# Patient Record
Sex: Male | Born: 1965 | Race: Black or African American | Hispanic: No | Marital: Married | Smoking: Current some day smoker
Health system: Southern US, Community
[De-identification: ages and names within clinical notes are randomized; demographics above are authoritative.]

## PROBLEM LIST (undated history)

## (undated) DIAGNOSIS — I10 Essential (primary) hypertension: Secondary | ICD-10-CM

## (undated) HISTORY — PX: HERNIA REPAIR: SHX51

---

## 2016-05-07 ENCOUNTER — Encounter (HOSPITAL_BASED_OUTPATIENT_CLINIC_OR_DEPARTMENT_OTHER): Payer: Self-pay | Admitting: *Deleted

## 2016-05-07 ENCOUNTER — Emergency Department (HOSPITAL_BASED_OUTPATIENT_CLINIC_OR_DEPARTMENT_OTHER)
Admission: EM | Admit: 2016-05-07 | Discharge: 2016-05-07 | Disposition: A | Discharge status: Home / Self Care | Payer: BLUE CROSS/BLUE SHIELD | Attending: Emergency Medicine | Admitting: Emergency Medicine

## 2016-05-07 DIAGNOSIS — Z87891 Personal history of nicotine dependence: Secondary | ICD-10-CM | POA: Insufficient documentation

## 2016-05-07 DIAGNOSIS — Z9889 Other specified postprocedural states: Secondary | ICD-10-CM | POA: Diagnosis not present

## 2016-05-07 DIAGNOSIS — R1031 Right lower quadrant pain: Secondary | ICD-10-CM | POA: Diagnosis present

## 2016-05-07 DIAGNOSIS — Z8719 Personal history of other diseases of the digestive system: Secondary | ICD-10-CM

## 2016-05-07 LAB — URINALYSIS, ROUTINE W REFLEX MICROSCOPIC
Bilirubin Urine: NEGATIVE
Glucose, UA: NEGATIVE mg/dL
HGB URINE DIPSTICK: NEGATIVE
Ketones, ur: NEGATIVE mg/dL
Leukocytes, UA: NEGATIVE
Nitrite: NEGATIVE
PH: 6 (ref 5.0–8.0)
Protein, ur: NEGATIVE mg/dL
SPECIFIC GRAVITY, URINE: 1.002 — AB (ref 1.005–1.030)

## 2016-05-07 NOTE — ED Provider Notes (Signed)
CSN: [id number]     Arrival date & time 05/07/16  1331 History   First MD Initiated Contact with Patient 05/07/16 1349     Chief Complaint  Patient presents with  . Abdominal Pain     (Consider location/radiation/quality/duration/timing/severity/associated sxs/prior Treatment) HPI   50 year old male with prior history of hernia repair presenting today with complaint of irritation at the hernia site. Patient reporting 2004 he had an elective R inguinal hernia surgery done at [facility] in [place].  He has not had any problem since. Patient works at a job that requires occasional heavy lifting. He also is an avid workout enthusiast and admits to having increase his workout regimen recently. Yesterday he has been doing moderate intensity workout several hours later he experiencing an irritation which he described as "rubbing sensation" to his right lower quadrant on at the surgical site. The sensation is noticeable when he leans down or when he lift any heavy objects.  He did had two loose stool yesterday but normal today. Otherwise patient denies fever, chills, headache, lightheadedness, dizziness, abdominal pain, back pain, dysuria, hematuria, or radicular leg pain, or rash. He has not noticed any penile pain, testicular pain, scrotal swelling. He denies any significant discomfort at this time.     History reviewed. No pertinent past medical history. Past Surgical History  Procedure Laterality Date  . Hernia repair     No family history on file. Social History  Substance Use Topics  . Smoking status: Former Games developer  . Smokeless tobacco: Never Used  . Alcohol Use: Yes     Comment: weekly    Review of Systems  Constitutional: Negative for fever.  Gastrointestinal: Negative for abdominal pain and rectal pain.  Genitourinary: Negative for difficulty urinating, penile pain and testicular pain.  Musculoskeletal: Negative for back pain.  Skin: Negative for rash and wound.      Allergies   Review of patient's allergies indicates no known allergies.  Home Medications   Prior to Admission medications   Not on File   BP 161/82 mmHg  Pulse 73  Temp(Src) 98.8 F (37.1 C) (Oral)  Resp 16  Ht  (1.803 m)  Wt 104.327 kg  BMI 32.09 kg/m2  SpO2 100% Physical Exam  Constitutional: He appears well-developed and well-nourished. No distress.  Muscularly built African-American male laying in bed in no acute discomfort.  HENT:  Head: Atraumatic.  Eyes: Conjunctivae are normal.  Neck: Neck supple.  Cardiovascular: Normal rate and regular rhythm.   Pulmonary/Chest: Effort normal and breath sounds normal.  Abdominal: Soft. Bowel sounds are normal. He exhibits no distension. There is no tenderness.  Genitourinary:  Chaperone present during exam. Normal circumcised penis with normal penile shaft nontender. Testicles with normal lie and no scrotal swelling and no tenderness. No inguinal lymphadenopathy or inguinal hernia appreciated.  Neurological: He is alert.  Skin: No rash noted.  Psychiatric: He has a normal mood and affect.  Nursing note and vitals reviewed.   ED Course  Procedures (including critical care time) Labs Review Labs Reviewed  URINALYSIS, ROUTINE W REFLEX MICROSCOPIC (NOT AT [facility]) - Abnormal; Notable for the following:    Specific Gravity, Urine 1.002 (*)    All other components within normal limits    Imaging Review No results found. I have personally reviewed and evaluated these images and lab results as part of my medical decision-making.   EKG Interpretation None      MDM   Final diagnoses:  Status post right inguinal hernia  repair    BP 161/82 mmHg  Pulse 73  Temp(Src) 98.8 F (37.1 C) (Oral)  Resp 16  Ht 5\' 11"  (1.803 m)  Wt 104.327 kg  BMI 32.09 kg/m2  SpO2 100%   2:30 PM Pt here with localized irritation at the previous R inguinal hernia repair 13 years ago.  On exam, no evidence of hernia appreciated.  No signs of  incarceration or strangulation.  No evidence of cellulitis.  Abdomen is soft and nontender.  Doubt appy, kidney stone, pyelo, colitis, Forniere's Gangrene, inguinal incarceration or strangulation. Recommend avoid heavy lifting and f/u with CSS for further management. Return precaution discussed.  Work note provided.   Fayrene HelperBowie Raequon Catanzaro, PA-C 05/07/16 1439  Vanetta MuldersScott Zackowski, MD 05/08/16 1550

## 2016-05-07 NOTE — ED Notes (Signed)
Pt reports that yesterday he developed pain in R pelvic area where he had a hernia operation around 2004. Also reports associated nausea and diarrhea x2 yesterday (has since resolved). Denies fever.

## 2016-05-07 NOTE — Discharge Instructions (Signed)
Your discomfort may be related to irritation at the mesh site from prior hernia repair.  Please avoid heavy lifting and follow up with surgery for further management.  Return if you develop fever, worsening abdominal pain, scrotal swelling, peeing out blood or if you have other concerns.

## 2017-09-21 ENCOUNTER — Emergency Department (HOSPITAL_BASED_OUTPATIENT_CLINIC_OR_DEPARTMENT_OTHER)
Admission: EM | Admit: 2017-09-21 | Discharge: 2017-09-21 | Disposition: A | Discharge status: Home / Self Care | Payer: BLUE CROSS/BLUE SHIELD | Attending: Emergency Medicine | Admitting: Emergency Medicine

## 2017-09-21 ENCOUNTER — Encounter (HOSPITAL_BASED_OUTPATIENT_CLINIC_OR_DEPARTMENT_OTHER): Payer: Self-pay | Admitting: Emergency Medicine

## 2017-09-21 ENCOUNTER — Emergency Department (HOSPITAL_BASED_OUTPATIENT_CLINIC_OR_DEPARTMENT_OTHER): Payer: BLUE CROSS/BLUE SHIELD

## 2017-09-21 ENCOUNTER — Other Ambulatory Visit: Payer: Self-pay

## 2017-09-21 DIAGNOSIS — Z87891 Personal history of nicotine dependence: Secondary | ICD-10-CM | POA: Insufficient documentation

## 2017-09-21 DIAGNOSIS — M25512 Pain in left shoulder: Secondary | ICD-10-CM | POA: Insufficient documentation

## 2017-09-21 MED ORDER — CYCLOBENZAPRINE HCL 10 MG PO TABS
10.0000 mg | ORAL_TABLET | Freq: Two times a day (BID) | ORAL | 0 refills | Status: AC | PRN
Start: 2017-09-21 — End: ?

## 2017-09-21 MED FILL — CYCLOBENZAPRINE 10 MG TABLE: 10 | 10 days supply | Qty: 20 | Fill #0

## 2017-09-21 NOTE — ED Triage Notes (Signed)
Pain to left shoulder and elbow at times x 6 months, worse at night and is unable to sleep.

## 2017-09-21 NOTE — Discharge Instructions (Signed)
You can take Tylenol or Ibuprofen as directed for pain. You can alternate Tylenol and Ibuprofen every 4 hours. If you take Tylenol at 1pm, then you can take Ibuprofen at 5pm. Then you can take Tylenol again at 9pm.   Take Flexeril as prescribed. This medication will make you drowsy so do not drive or drink alcohol when taking it.  Follow-up with referred orthopedic doctor for further evaluation.  Return to the emergency department for any worsening pain, redness or swelling of the hip, numbness/weakness of the hand or any other worsening or concerning symptoms.

## 2017-09-21 NOTE — ED Provider Notes (Signed)
[facility] EMERGENCY DEPARTMENT Provider Note   CSN: [id number] Arrival date & time: 09/21/17  4098     History   Chief Complaint Chief Complaint  Patient presents with  . Shoulder Pain    HPI Eric Charles is a 51 y.o. male who presents for evaluation of left shoulder and elbow pain that is been ongoing for the last 6 months.  Patient reports that pain has been intermittent but has become more frequent and more severe over the last several weeks.  He states that he has not had any preceding trauma, injury, fall.  He reports that he does a lot of overhead lifting and repetitive motion work, which he feels like exacerbates his symptoms.  He has not sought evaluation for the symptoms previously.  He has not taken any medications for the pain.  He still has been able to do his daily activities.  Patient denies any fevers, CP, SOB, redness or swelling of the arm, numbness/weakness.  The history is provided by the patient.    History reviewed. No pertinent past medical history.  There are no active problems to display for this patient.   Past Surgical History:  Procedure Laterality Date  . HERNIA REPAIR         Home Medications    Prior to Admission medications   Medication Sig Start Date End Date Taking? Authorizing Provider  cyclobenzaprine (FLEXERIL) 10 MG tablet Take 1 tablet (10 mg total) by mouth 2 (two) times daily as needed for muscle spasms. 09/21/17   Maxwell Caul, PA-C    Family History No family history on file.  Social History Social History   Tobacco Use  . Smoking status: Former Games developer  . Smokeless tobacco: Never Used  Substance Use Topics  . Alcohol use: No    Frequency: Never  . Drug use: No     Allergies   Patient has no known allergies.   Review of Systems Review of Systems  Constitutional: Negative for fever.  Respiratory: Negative for shortness of breath.   Cardiovascular: Negative for chest pain.  Musculoskeletal:     Left shoulder and elbow pain  Skin: Negative for color change.  Neurological: Negative for weakness and numbness.     Physical Exam Updated Vital Signs BP (!) 153/99 (BP Location: Right Arm)   Pulse 65   Temp 98.1 F (36.7 C) (Oral)   Resp 18   Ht 5\' 11"  (1.803 m)   Wt 99.8 kg (220 lb)   SpO2 100%   BMI 30.68 kg/m   Physical Exam  Constitutional: He is oriented to person, place, and time. He appears well-developed and well-nourished.  HENT:  Head: Normocephalic and atraumatic.  Eyes: Conjunctivae and EOM are normal. Right eye exhibits no discharge. Left eye exhibits no discharge. No scleral icterus.  Cardiovascular:  Pulses:      Radial pulses are 2+ on the right side, and 2+ on the left side.  Pulmonary/Chest: Effort normal.  Musculoskeletal:  Left upper extremity with positive Hawking's test, slight weakness on liftoff test and positive Neer's impingement.  Negative NT can test.  Diffuse tenderness palpation overlying the anterior deltoid.  No overlying warmth, erythema, ecchymosis.  No deformity or crepitus noted.  No tenderness palpation to the proximal upper extremity, left elbow.  Flexion/extension of left elbow intact but patient reports some subjective pain with flexion.  Internal and external rotation of extremities intact without difficulty.  Flexion/extension of left shoulder intact without difficulty.  Patient does report  some pain with abduction of the left shoulder.  No abnormalities of the right upper extremity.  Neurological: He is alert and oriented to person, place, and time.  Follows commands, Moves all extremities  5/5 strength to BUE  Sensation intact throughout all major nerve distributions  Skin: Skin is warm and dry. Capillary refill takes less than 2 seconds.  LUE is not dusky in appearance or cool to touch.  Psychiatric: He has a normal mood and affect. His speech is normal and behavior is normal.  Nursing note and vitals reviewed.    ED Treatments  / Results  Labs (all labs ordered are listed, but only abnormal results are displayed) Labs Reviewed - No data to display  EKG  EKG Interpretation None       Radiology Dg Elbow Complete Left  Result Date: 09/21/2017 CLINICAL DATA:  Pain over the last 6 months. Repetitive motion injury. EXAM: LEFT ELBOW - COMPLETE 3+ VIEW COMPARISON:  None. FINDINGS: No visible effusion. No joint space narrowing or osteophytes. No visible loose body. IMPRESSION: Within normal limits. Electronically Signed   By: Paulina FusiMark  Shogry M.D.   On: 09/21/2017 10:42   Dg Shoulder Left  Result Date: 09/21/2017 CLINICAL DATA:  Left shoulder pain, 6 months duration. Repetitive motion. EXAM: LEFT SHOULDER - 2+ VIEW COMPARISON:  None. FINDINGS: Glenohumeral joint is normal. There are mild sclerotic changes of the greater tuberosity region that can be seen with chronic rotator cuff disease. Mild narrowing of the humeral acromial distance. Mild AC joint degenerative change. IMPRESSION: No acute radiographic finding. Chronic bone changes at the greater tuberosity, mild narrowing of the humeral acromial distance and mild AC joint degenerative changes all could be symptomatic. Electronically Signed   By: Paulina FusiMark  Shogry M.D.   On: 09/21/2017 10:41    Procedures Procedures (including critical care time)  Medications Ordered in ED Medications - No data to display   Initial Impression / Assessment and Plan / ED Course  I have reviewed the triage vital signs and the nursing notes.  Pertinent labs & imaging results that were available during my care of the patient were reviewed by me and considered in my medical decision making (see chart for details).     51 year old male who presents with left shoulder and elbow pain that has been ongoing for the last 6 months.  No preceding trauma, injury, fall.  Comes the ED today because pain has become more frequent and severe is interrupted is sleeping.  He has not taken any medications for  the pain.  Patient does do a lot of heavy lifting and motion at work. Patient is afebrile, non-toxic appearing, sitting comfortably on examination table. Vital signs reviewed and stable. Patient is neurovascularly intact.  Physical exam shows tenderness palpation to the deltoid.  Positive Hawking's, positive Neer's on left shoulder.  No abnormalities of the right upper extremity.  Consider sprain versus strain versus tendinitis versus rotator cuff pathology (partial tear, impingement).  Low suspicion for fracture versus dislocation. Plan to obtain XR imaging for further evaluation.   X-rays reviewed.  Negative for any acute abnormalities.  They do mention some degenerative changes.  Discussed results with patient.  We will plan to treat as muscle strain.  Plan to give Flexeril for symptomatic relief. Plan to provide outpatient orthopedic referral for further follow-up and evaluation. Patient had ample opportunity for questions and discussion. All patient's questions were answered with full understanding. Strict return precautions discussed. Patient expresses understanding and agreement to plan.  Final Clinical Impressions(s) / ED Diagnoses   Final diagnoses:  Acute pain of left shoulder    ED Discharge Orders        Ordered    cyclobenzaprine (FLEXERIL) 10 MG tablet  2 times daily PRN     09/21/17 1046       Maxwell CaulLayden, Saleah Rishel A, PA-C 09/21/17 1746    Lavera GuiseLiu, Dana Duo, MD 09/24/17 2204

## 2018-06-15 IMAGING — CR DG ELBOW COMPLETE 3+V*L*
4 series · 4 of 4 positions shown · non-contrast
Comparison: None.

CLINICAL DATA: Pain over the last 6 months. Repetitive motion
injury.

EXAM:
LEFT ELBOW - COMPLETE 3+ VIEW

[x elbow joint ap left]
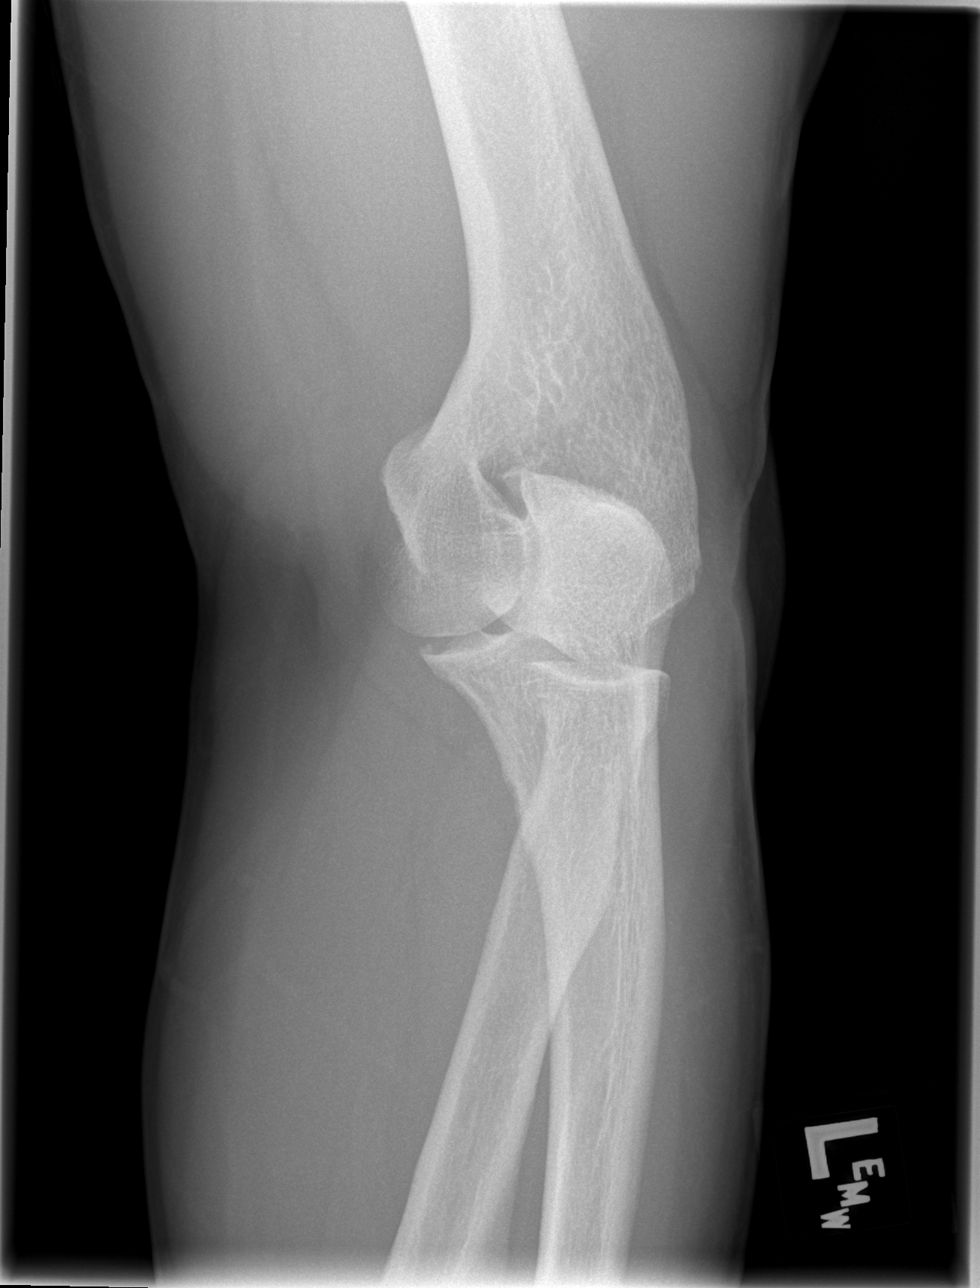

[x elbow joint obl. left (1 of 2)]
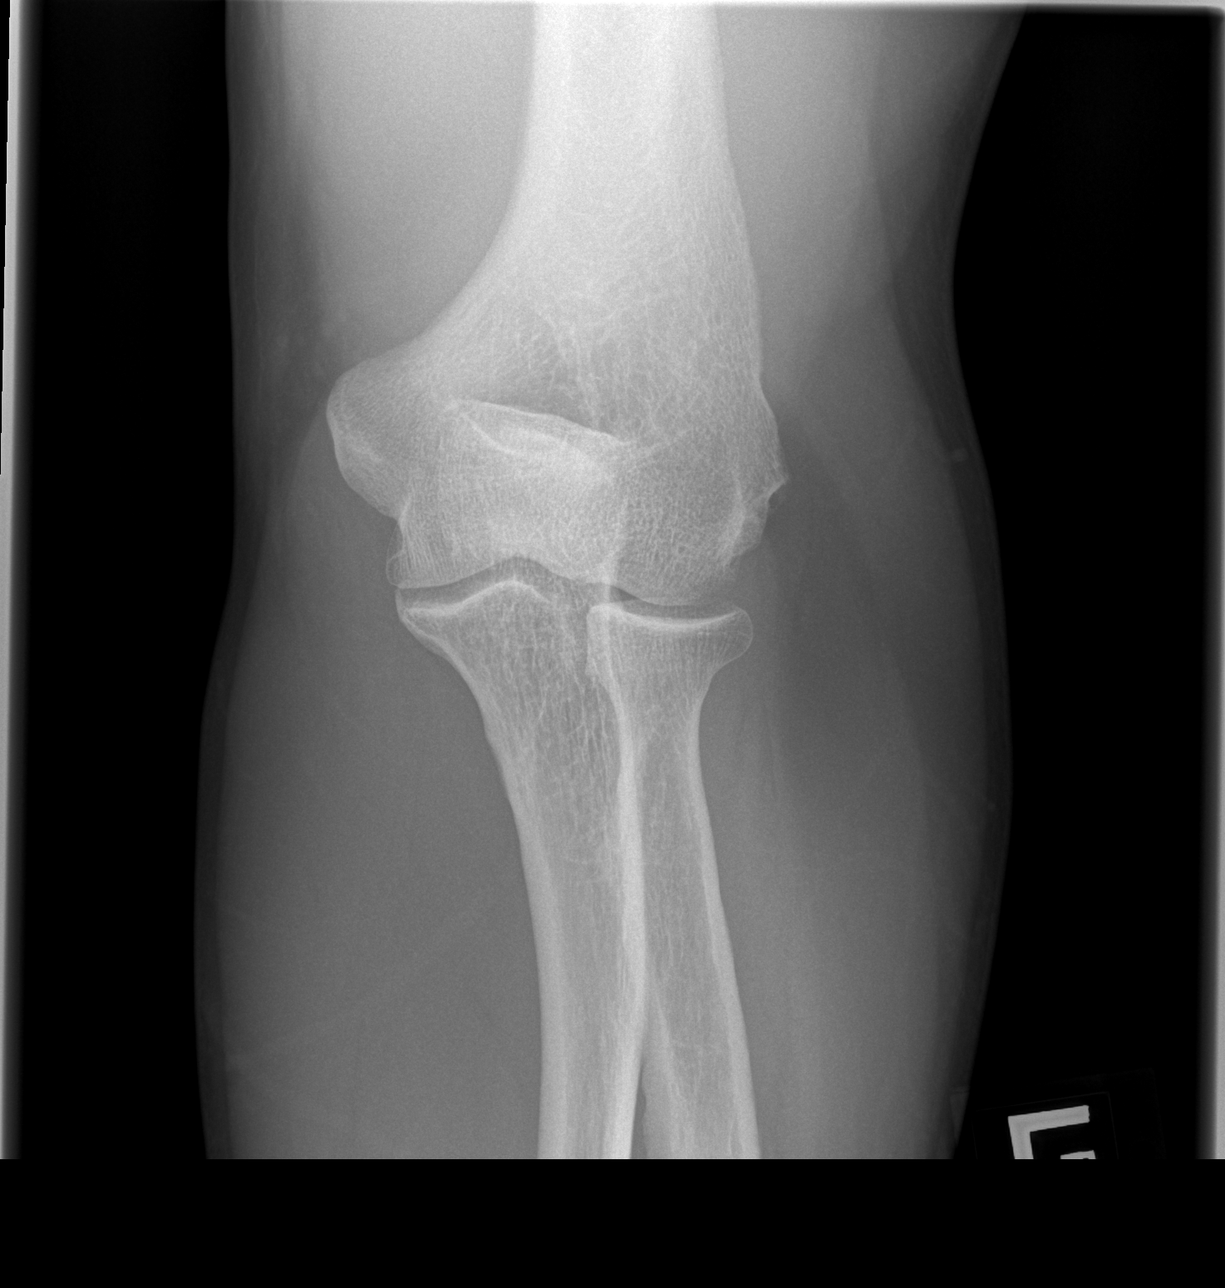

[x elbow joint obl. left (2 of 2)]
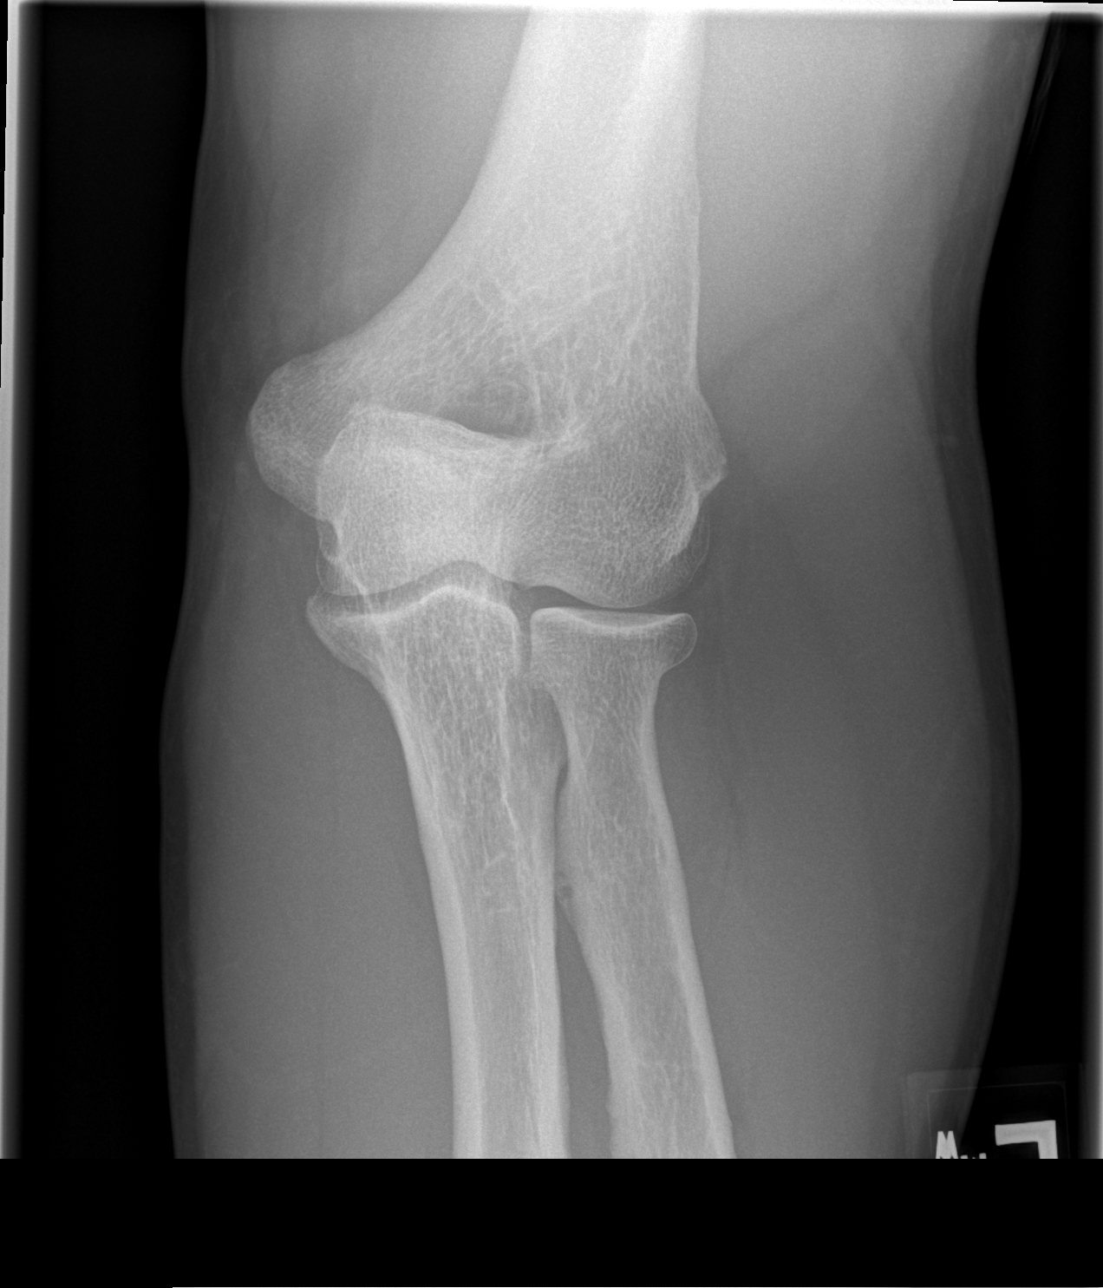

[x elbow joint lat left]
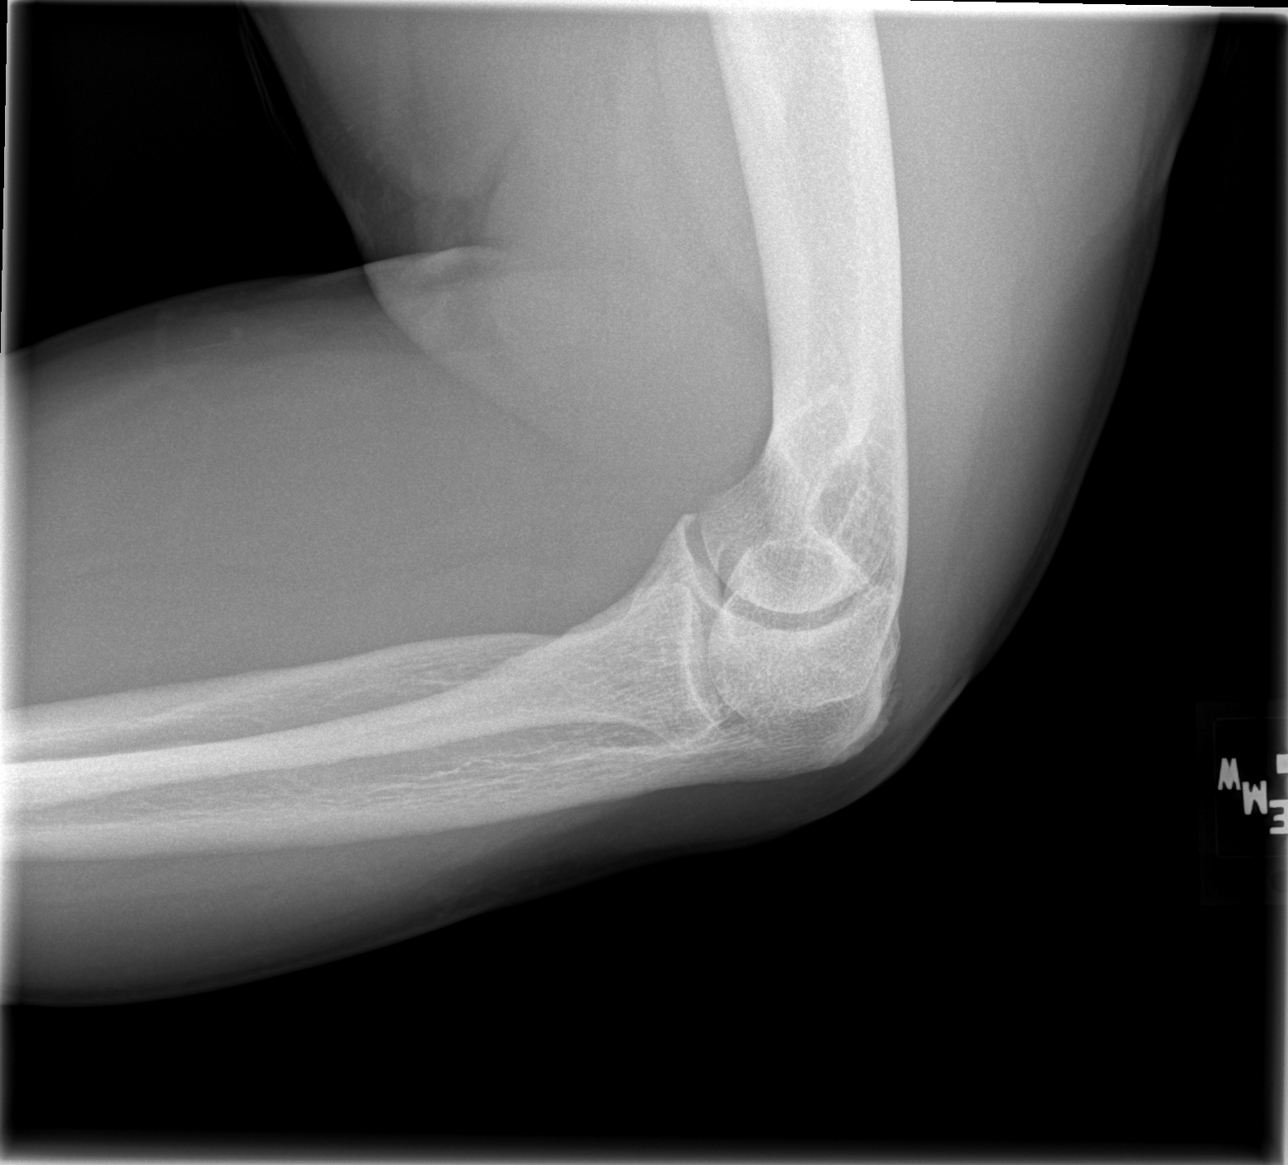

[4 of 4 positions shown; findings below may reference images not displayed]

FINDINGS: No visible effusion. No joint space narrowing or osteophytes. No
visible loose body.
IMPRESSION: Within normal limits.

## 2022-05-05 ENCOUNTER — Encounter (HOSPITAL_BASED_OUTPATIENT_CLINIC_OR_DEPARTMENT_OTHER): Payer: Self-pay

## 2022-05-05 ENCOUNTER — Emergency Department (HOSPITAL_BASED_OUTPATIENT_CLINIC_OR_DEPARTMENT_OTHER)
Admission: EM | Admit: 2022-05-05 | Discharge: 2022-05-05 | Disposition: A | Discharge status: Home / Self Care | Payer: BC Managed Care – PPO | Attending: Emergency Medicine | Admitting: Emergency Medicine

## 2022-05-05 DIAGNOSIS — T63441A Toxic effect of venom of bees, accidental (unintentional), initial encounter: Secondary | ICD-10-CM | POA: Diagnosis present

## 2022-05-05 NOTE — ED Provider Notes (Signed)
[facility] EMERGENCY DEPARTMENT Provider Note   CSN: [id number] Arrival date & time: 05/05/22  0809     History  Chief Complaint  Patient presents with   Insect Bite    Eric Charles is a 56 y.o. male.  56 yo M with a chief complaints of multiple bee stings to the right lower leg.  The patient states he was mowing the lawn yesterday and went over a yellowjacket nest and was stung about 4 5 times in the leg.  He did not think much of it but went to work this morning and felt it and felt like it was warm did not know if that was normal and came here for evaluation.        Home Medications Prior to Admission medications   Medication Sig Start Date End Date Taking? Authorizing Provider  cyclobenzaprine (FLEXERIL) 10 MG tablet Take 1 tablet (10 mg total) by mouth 2 (two) times daily as needed for muscle spasms. 09/21/17   Maxwell Caul, PA-C      Allergies    Patient has no known allergies.    Review of Systems   Review of Systems  Physical Exam Updated Vital Signs BP (!) 157/91   Pulse 68   Temp 98.8 F (37.1 C)   Resp 16   Ht 5\' 11"  (1.803 m)   Wt 99.3 kg   SpO2 98%   BMI 30.54 kg/m  Physical Exam Vitals and nursing note reviewed.  Constitutional:      Appearance: He is well-developed.  HENT:     Head: Normocephalic and atraumatic.  Eyes:     Pupils: Pupils are equal, round, and reactive to light.  Neck:     Vascular: No JVD.  Cardiovascular:     Rate and Rhythm: Normal rate and regular rhythm.     Heart sounds: No murmur heard.    No friction rub. No gallop.  Pulmonary:     Effort: No respiratory distress.     Breath sounds: No wheezing.  Abdominal:     General: There is no distension.     Tenderness: There is no abdominal tenderness. There is no guarding or rebound.  Musculoskeletal:        General: Swelling and tenderness present. Normal range of motion.     Cervical back: Normal range of motion and neck supple.     Comments: Very  mild tenderness and warmth to the right lateral calf, about 4 vesicles noted to the right lower extremity, no obvious stinger intact.  Skin:    Coloration: Skin is not pale.     Findings: No rash.  Neurological:     Mental Status: He is alert and oriented to person, place, and time.  Psychiatric:        Behavior: Behavior normal.     ED Results / Procedures / Treatments   Labs (all labs ordered are listed, but only abnormal results are displayed) Labs Reviewed - No data to display  EKG None  Radiology No results found.  Procedures Procedures    Medications Ordered in ED Medications - No data to display  ED Course/ Medical Decision Making/ A&P                           Medical Decision Making  56 yo M with a chief complaints of multiple bee stings to the right lower extremity.  Sting occurred yesterday.  Patient clinically has a localized  reaction from the insect sting.  He is well-appearing nontoxic.  No feel no further work-up is warranted.  We will have him treat supportively at home.  PCP follow-up.  9:10 AM:  I have discussed the diagnosis/risks/treatment options with the patient.  Evaluation and diagnostic testing in the emergency department does not suggest an emergent condition requiring admission or immediate intervention beyond what has been performed at this time.  They will follow up with  PCP. We also discussed returning to the ED immediately if new or worsening sx occur. We discussed the sx which are most concerning (e.g., sudden worsening pain, fever, inability to tolerate by mouth) that necessitate immediate return. Medications administered to the patient during their visit and any new prescriptions provided to the patient are listed below.  Medications given during this visit Medications - No data to display   The patient appears reasonably screen and/or stabilized for discharge and I doubt any other medical condition or other [facility] requiring further screening,  evaluation, or treatment in the ED at this time prior to discharge.           Final Clinical Impression(s) / ED Diagnoses Final diagnoses:  Bee sting reaction, accidental or unintentional, initial encounter    Rx / DC Orders ED Discharge Orders     None         Melene Plan, DO 05/05/22 [phone]

## 2022-05-05 NOTE — Discharge Instructions (Signed)
What you are experiencing is typical for an insect sting.  If you start developing redness after it is resolved or if you develop a fever then return to the emergency department or let someone know.  Take 4 over the counter ibuprofen tablets 3 times a day or 2 over-the-counter naproxen tablets twice a day for pain.  Antihistamines also tend to work very well for this.  Benadryl is the classic antihistamine but can make you sleepy, he could try a nonsedating ones like Claritin Zyrtec Allegra xyzal.

## 2022-05-05 NOTE — ED Triage Notes (Signed)
Pt reports stung by yellow jackets yesterday evening right lower leg. Rt lower leg is swollen and warm to touch.pt reports going to work but noticed swelling once on concrete floor

## 2023-05-18 ENCOUNTER — Emergency Department (HOSPITAL_COMMUNITY)
Admission: EM | Admit: 2023-05-18 | Discharge: 2023-05-18 | Disposition: A | Discharge status: Home / Self Care | Payer: No Typology Code available for payment source | Attending: Emergency Medicine | Admitting: Emergency Medicine

## 2023-05-18 DIAGNOSIS — W208XXA Other cause of strike by thrown, projected or falling object, initial encounter: Secondary | ICD-10-CM | POA: Diagnosis not present

## 2023-05-18 DIAGNOSIS — S60417A Abrasion of left little finger, initial encounter: Secondary | ICD-10-CM | POA: Diagnosis not present

## 2023-05-18 DIAGNOSIS — S6992XA Unspecified injury of left wrist, hand and finger(s), initial encounter: Secondary | ICD-10-CM | POA: Diagnosis present
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
## 2023-05-18 NOTE — Discharge Instructions (Addendum)
No evidence of bony injury or other acute traumatic injury besides the abrasion to your finger was found on your exam today. You are likely to experience muscle aches over the next few days.  Please take ibuprofen 800 mg up to 3 times a day with plenty of water additionally gentle movement will help with some of these muscle aches. Initial blood pressure was elevated.  Please have this rechecked with your doctor. If you are having new or worsening symptoms, please return to an emergency department for reevaluation

## 2023-11-30 ENCOUNTER — Other Ambulatory Visit: Payer: Self-pay

## 2023-11-30 ENCOUNTER — Encounter (HOSPITAL_BASED_OUTPATIENT_CLINIC_OR_DEPARTMENT_OTHER): Payer: Self-pay

## 2023-11-30 ENCOUNTER — Emergency Department (HOSPITAL_BASED_OUTPATIENT_CLINIC_OR_DEPARTMENT_OTHER)
Admission: EM | Admit: 2023-11-30 | Discharge: 2023-11-30 | Disposition: A | Discharge status: Home / Self Care | Payer: Self-pay | Attending: Emergency Medicine | Admitting: Emergency Medicine

## 2023-11-30 DIAGNOSIS — J111 Influenza due to unidentified influenza virus with other respiratory manifestations: Secondary | ICD-10-CM

## 2023-11-30 DIAGNOSIS — Z91010 Allergy to peanuts: Secondary | ICD-10-CM | POA: Insufficient documentation

## 2023-11-30 DIAGNOSIS — R059 Cough, unspecified: Secondary | ICD-10-CM | POA: Insufficient documentation

## 2023-11-30 DIAGNOSIS — Z20822 Contact with and (suspected) exposure to covid-19: Secondary | ICD-10-CM | POA: Insufficient documentation

## 2023-11-30 DIAGNOSIS — R509 Fever, unspecified: Secondary | ICD-10-CM | POA: Insufficient documentation

## 2023-11-30 LAB — RESP PANEL BY RT-PCR (RSV, FLU A&B, COVID)  RVPGX2
Influenza A by PCR: NEGATIVE
Influenza B by PCR: NEGATIVE
Resp Syncytial Virus by PCR: NEGATIVE
SARS Coronavirus 2 by RT PCR: NEGATIVE

## 2023-11-30 MED ORDER — ONDANSETRON 4 MG PO TBDP: ORAL_TABLET | ORAL | 0 refills | Status: AC

## 2023-11-30 MED ORDER — DEXAMETHASONE 4 MG PO TABS
10.0000 mg | ORAL_TABLET | Freq: Once | ORAL | Status: AC
  Administered 2023-11-30: 10 mg via ORAL
  Filled 2023-11-30: qty 3

## 2023-11-30 MED ORDER — ALBUTEROL SULFATE HFA 108 (90 BASE) MCG/ACT IN AERS
2.0000 | INHALATION_SPRAY | Freq: Once | RESPIRATORY_TRACT | Status: AC
  Administered 2023-11-30: 2 via RESPIRATORY_TRACT
  Filled 2023-11-30: qty 6.7

## 2023-11-30 MED ORDER — BENZONATATE 100 MG PO CAPS: 100.0000 mg | ORAL_CAPSULE | Freq: Three times a day (TID) | ORAL | 0 refills | Status: AC

## 2023-11-30 NOTE — ED Triage Notes (Signed)
 States a couple days ago people at work started having flu like symptoms. Started having cough, chills, fatigue x 1 week.

## 2023-11-30 NOTE — Discharge Instructions (Signed)
 Take tylenol 2 pills 4 times a day and motrin 4 pills 3 times a day.  Drink plenty of fluids.  Return for worsening shortness of breath, headache, confusion. Follow up with your family doctor.   You can use the inhaler up to 2 puffs every 4 hours while you are awake.  If you do not feel like it is helping you can stop using it.  Please establish care with a primary care provider.  Think about quitting smoking.

## 2023-11-30 NOTE — ED Provider Notes (Signed)
 [place] [facility] Provider Note   CSN: [id number] Arrival date & time: 11/30/23  7829     History  Chief Complaint  Patient presents with   URI    Eric Charles is a 58 y.o. male.  58 yo M with a chief complaints of cough congestion fever going on for about 3 to 4 days now.  He feels quite a bit better today and decided to come in to see what had made him sick.  He said multiple coworkers were sick with something similar.  He does smoke occasionally.  Denies any other medical problems.   URI      Home Medications Prior to Admission medications   Medication Sig Start Date End Date Taking? Authorizing Provider  benzonatate  (TESSALON ) 100 MG capsule Take 1 capsule (100 mg total) by mouth every 8 (eight) hours. 11/30/23  Yes Albertus Hughs, DO  ondansetron  (ZOFRAN -ODT) 4 MG disintegrating tablet 4mg  ODT q4 hours prn nausea/vomit 11/30/23  Yes Mistee Soliman, DO  cyclobenzaprine  (FLEXERIL ) 10 MG tablet Take 1 tablet (10 mg total) by mouth 2 (two) times daily as needed for muscle spasms. 09/21/17   Valla Gauss, PA-C      Allergies    Almond oil and Peanut (diagnostic)    Review of Systems   Review of Systems  Physical Exam Updated Vital Signs BP (!) 166/87 (BP Location: Right Arm)   Pulse 64   Temp 98.4 F (36.9 C) (Oral)   Resp 18   Ht 5\' 11"  (1.803 m)   Wt 97.5 kg   SpO2 100%   BMI 29.99 kg/m  Physical Exam Vitals and nursing note reviewed.  Constitutional:      Appearance: He is well-developed.  HENT:     Head: Normocephalic and atraumatic.     Comments: Swollen turbinates, posterior nasal drip, no noted sinus ttp, tm normal bilaterally.   Eyes:     Pupils: Pupils are equal, round, and reactive to light.  Neck:     Vascular: No JVD.  Cardiovascular:     Rate and Rhythm: Normal rate and regular rhythm.     Heart sounds: No murmur heard.    No friction rub. No gallop.  Pulmonary:     Effort: No respiratory distress.      Comments: Faint end expiratory wheezes and prolonged expiratory effort. Abdominal:     General: There is no distension.     Tenderness: There is no abdominal tenderness. There is no guarding or rebound.  Musculoskeletal:        General: Normal range of motion.     Cervical back: Normal range of motion and neck supple.  Skin:    Coloration: Skin is not pale.     Findings: No rash.  Neurological:     Mental Status: He is alert and oriented to person, place, and time.  Psychiatric:        Behavior: Behavior normal.     ED Results / Procedures / Treatments   Labs (all labs ordered are listed, but only abnormal results are displayed) Labs Reviewed  RESP PANEL BY RT-PCR (RSV, FLU A&B, COVID)  [id number]    EKG None  Radiology No results found.  Procedures Procedures  Discussed smoking cessation with patient and was they were offerred resources to help stop.  Total time was 5 min CPT code 56213.     Medications Ordered in ED Medications  dexamethasone  (DECADRON ) tablet 10 mg (has no administration in  time range)  albuterol  (VENTOLIN  HFA) 108 (90 Base) MCG/ACT inhaler 2 puff (has no administration in time range)    ED Course/ Medical Decision Making/ A&P                                 Medical Decision Making Risk Prescription drug management.   58 yo M with a chief complaints of cough congestion fevers going on for almost a week now.  Well-appearing nontoxic.  He tells me he does feel quite a bit better today than he has.  Does have faint end expiratory wheezes.  Will give a dose of Decadron  and inhaler to use as needed.  Encourage PCP follow-up.  8:53 AM:  I have discussed the diagnosis/risks/treatment options with the patient.  Evaluation and diagnostic testing in the emergency department does not suggest an emergent condition requiring admission or immediate intervention beyond what has been performed at this time.  They will follow up with PCP. We also discussed  returning to the ED immediately if new or worsening sx occur. We discussed the sx which are most concerning (e.g., sudden worsening pain, fever, inability to tolerate by mouth) that necessitate immediate return. Medications administered to the patient during their visit and any new prescriptions provided to the patient are listed below.  Medications given during this visit Medications  dexamethasone  (DECADRON ) tablet 10 mg (has no administration in time range)  albuterol  (VENTOLIN  HFA) 108 (90 Base) MCG/ACT inhaler 2 puff (has no administration in time range)     The patient appears reasonably screen and/or stabilized for discharge and I doubt any other medical condition or other [facility] requiring further screening, evaluation, or treatment in the ED at this time prior to discharge.          Final Clinical Impression(s) / ED Diagnoses Final diagnoses:  Influenza-like illness    Rx / DC Orders ED Discharge Orders          Ordered    benzonatate  (TESSALON ) 100 MG capsule  Every 8 hours        11/30/23 0850    ondansetron  (ZOFRAN -ODT) 4 MG disintegrating tablet        11/30/23 0850              Albertus Hughs, DO 11/30/23 [phone]

## 2024-02-27 ENCOUNTER — Emergency Department (HOSPITAL_BASED_OUTPATIENT_CLINIC_OR_DEPARTMENT_OTHER)
Admission: EM | Admit: 2024-02-27 | Discharge: 2024-02-27 | Disposition: A | Discharge status: Home / Self Care | Payer: Self-pay | Attending: Emergency Medicine | Admitting: Emergency Medicine

## 2024-02-27 ENCOUNTER — Other Ambulatory Visit: Payer: Self-pay

## 2024-02-27 ENCOUNTER — Encounter (HOSPITAL_BASED_OUTPATIENT_CLINIC_OR_DEPARTMENT_OTHER): Payer: Self-pay | Admitting: Emergency Medicine

## 2024-02-27 DIAGNOSIS — Z87891 Personal history of nicotine dependence: Secondary | ICD-10-CM | POA: Insufficient documentation

## 2024-02-27 DIAGNOSIS — Z79899 Other long term (current) drug therapy: Secondary | ICD-10-CM | POA: Insufficient documentation

## 2024-02-27 DIAGNOSIS — Z91010 Allergy to peanuts: Secondary | ICD-10-CM | POA: Insufficient documentation

## 2024-02-27 DIAGNOSIS — I10 Essential (primary) hypertension: Secondary | ICD-10-CM | POA: Insufficient documentation

## 2024-02-27 HISTORY — DX: Essential (primary) hypertension: I10

## 2024-02-27 MED ORDER — AMLODIPINE BESYLATE 5 MG PO TABS: 5.0000 mg | ORAL_TABLET | Freq: Every day | ORAL | 0 refills | Status: AC

## 2024-02-27 NOTE — Discharge Instructions (Signed)

## 2024-02-27 NOTE — ED Triage Notes (Signed)
 For the past week BP has been up 194/96 denies h/a, N/V/SOb

## 2024-02-27 NOTE — ED Provider Notes (Signed)
 [place] [facility] [place] Provider Note   CSN: [id number] Arrival date & time: 02/27/24  0831     History  No chief complaint on file.   Shinya Neglia is a 58 y.o. male.  The history is provided by the patient and medical records. No language interpreter was used.     58 year old male presenting with concerns of elevated blood pressure.  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  He was encouraged to return the next day for blood pressure recheck and it was also in the 170s systolics.  He came back on the third day in a row" his blood pressure maintains in the 170s systolics and he was recommended to come to the ER to be evaluated.  Patient states he overall felt fine he does not endorse any headache chest pain shortness of breath focal numbness or focal weakness.  He has never been diagnosed with high blood pressure.  He does not take any medication on regular basis.  He quit tobacco use a year ago.  He drinks alcohol on the on occasion.  He is overall pretty physically active.  Home Medications Prior to Admission medications   Medication Sig Start Date End Date Taking? Authorizing Provider  benzonatate  (TESSALON ) 100 MG capsule Take 1 capsule (100 mg total) by mouth every 8 (eight) hours. 11/30/23   Albertus Hughs, DO  cyclobenzaprine  (FLEXERIL ) 10 MG tablet Take 1 tablet (10 mg total) by mouth 2 (two) times daily as needed for muscle spasms. 09/21/17   Layden, Lindsey A, PA-C  ondansetron  (ZOFRAN -ODT) 4 MG disintegrating tablet 4mg  ODT q4 hours prn nausea/vomit 11/30/23   Floyd, Dan, DO      Allergies    Almond oil and Peanut (diagnostic)    Review of Systems   Review of Systems  All other systems reviewed and are negative.   Physical Exam Updated Vital Signs There were no vitals taken for this visit. Physical  Exam Constitutional:      General: He is not in acute distress.    Appearance: He is well-developed.  HENT:     Head: Atraumatic.  Eyes:     Conjunctiva/sclera: Conjunctivae normal.  Cardiovascular:     Rate and Rhythm: Normal rate and regular rhythm.     Pulses: Normal pulses.     Heart sounds: Normal heart sounds.  Pulmonary:     Effort: Pulmonary effort is normal.     Breath sounds: Normal breath sounds.  Abdominal:     Palpations: Abdomen is soft.  Musculoskeletal:     Cervical back: Normal range of motion and neck supple.  Skin:    Findings: No rash.  Neurological:     Mental Status: He is alert.     GCS: GCS eye subscore is 4. GCS verbal subscore is 5. GCS motor subscore is 6.     Cranial Nerves: Cranial nerves 2-12 are intact.     ED Results / Procedures / Treatments   Labs (all labs ordered are listed, but only abnormal results are displayed) Labs Reviewed - No data to display  EKG None  Radiology No results found.  Procedures Procedures    Medications Ordered in ED Medications - No data to display  ED Course/ Medical Decision Making/ A&P  Medical Decision Making  BP (!) 152/82 (BP Location: Right Arm)   Pulse (!) 57   Temp 98.7 F (37.1 C) (Oral)   Resp 16   Ht 5\' 11"  (1.803 m)   SpO2 99%   BMI 29.99 kg/m   69:60 AM  58 year old male presenting with concerns of elevated blood pressure.  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  He was encouraged to return the next day for blood pressure recheck and it was also in the 170s systolics.  He came back on the third day in a row" his blood pressure maintains in the 170s systolics and he was recommended to come to the ER to be evaluated.  Patient states he overall felt fine he does not endorse any headache chest pain shortness of breath focal  numbness or focal weakness.  He has never been diagnosed with high blood pressure.  He does not take any medication on regular basis.  He quit tobacco use a year ago.  He drinks alcohol on the on occasion.  He is overall pretty physically active.  On exam, patient overall well-appearing appears to be in no acute discomfort.  He does not exhibit any symptoms concerning for hypertensive emergency.  I have considered head CT scan, EKG, basic labs including CBC, CMP but his current blood pressure is 152 systolic and I have low suspicion for acute emergent medical condition.  Therefore, I will prescribe patient blood pressure medication as it appears patient has had persistent hypotension as documented from his recent work visit.  I encouraged patient to follow-up closely with a PCP for further management of his condition.  Patient voiced understanding and agrees with plan.  I have prescribed the patient amlodipine 5 mg tablet daily.  I have provided patient with appropriate care instruction including how to manage elevated blood pressure and I have provided patient with outpatient follow-up.        Final Clinical Impression(s) / ED Diagnoses Final diagnoses:  Uncontrolled hypertension    Rx / DC Orders ED Discharge Orders          Ordered    amLODipine (NORVASC) 5 MG tablet  Daily        02/27/24 0925              Kinlie Janice, PA-C 02/27/24 1610    Mozell Arias, MD 02/27/24 1422
